# Patient Record
Sex: Female | Born: 1970 | Race: White | Hispanic: No | Marital: Married | Smoking: Current every day smoker
Health system: Southern US, Community
[De-identification: ages and names within clinical notes are randomized; demographics above are authoritative.]

## PROBLEM LIST (undated history)

## (undated) DIAGNOSIS — F32A Depression, unspecified: Secondary | ICD-10-CM

## (undated) DIAGNOSIS — E119 Type 2 diabetes mellitus without complications: Secondary | ICD-10-CM

## (undated) DIAGNOSIS — T7840XA Allergy, unspecified, initial encounter: Secondary | ICD-10-CM

## (undated) DIAGNOSIS — M1990 Unspecified osteoarthritis, unspecified site: Secondary | ICD-10-CM

## (undated) DIAGNOSIS — E785 Hyperlipidemia, unspecified: Secondary | ICD-10-CM

## (undated) DIAGNOSIS — K219 Gastro-esophageal reflux disease without esophagitis: Secondary | ICD-10-CM

## (undated) DIAGNOSIS — I499 Cardiac arrhythmia, unspecified: Secondary | ICD-10-CM

## (undated) DIAGNOSIS — D649 Anemia, unspecified: Secondary | ICD-10-CM

## (undated) DIAGNOSIS — I10 Essential (primary) hypertension: Secondary | ICD-10-CM

## (undated) DIAGNOSIS — G709 Myoneural disorder, unspecified: Secondary | ICD-10-CM

## (undated) DIAGNOSIS — F319 Bipolar disorder, unspecified: Secondary | ICD-10-CM

## (undated) DIAGNOSIS — F329 Major depressive disorder, single episode, unspecified: Secondary | ICD-10-CM

## (undated) DIAGNOSIS — F419 Anxiety disorder, unspecified: Secondary | ICD-10-CM

## (undated) HISTORY — DX: Essential (primary) hypertension: I10

## (undated) HISTORY — DX: Gastro-esophageal reflux disease without esophagitis: K21.9

## (undated) HISTORY — DX: Bipolar disorder, unspecified: F31.9

## (undated) HISTORY — DX: Anemia, unspecified: D64.9

## (undated) HISTORY — PX: TOE SURGERY: SHX1073

## (undated) HISTORY — DX: Myoneural disorder, unspecified: G70.9

## (undated) HISTORY — DX: Cardiac arrhythmia, unspecified: I49.9

## (undated) HISTORY — PX: AMPUTATION: SHX166

## (undated) HISTORY — DX: Allergy, unspecified, initial encounter: T78.40XA

## (undated) HISTORY — DX: Type 2 diabetes mellitus without complications: E11.9

## (undated) HISTORY — DX: Unspecified osteoarthritis, unspecified site: M19.90

## (undated) HISTORY — PX: PARTIAL HYSTERECTOMY: SHX80

## (undated) HISTORY — DX: Anxiety disorder, unspecified: F41.9

## (undated) HISTORY — DX: Depression, unspecified: F32.A

## (undated) HISTORY — PX: ANKLE SURGERY: SHX546

## (undated) HISTORY — DX: Hyperlipidemia, unspecified: E78.5

## (undated) HISTORY — PX: TUBAL LIGATION: SHX77

## (undated) HISTORY — DX: Major depressive disorder, single episode, unspecified: F32.9

## (undated) HISTORY — PX: BUNIONECTOMY: SHX129

---

## 2008-11-16 ENCOUNTER — Ambulatory Visit: Payer: Self-pay | Admitting: Gastroenterology

## 2009-01-18 ENCOUNTER — Ambulatory Visit: Payer: Self-pay | Admitting: Gastroenterology

## 2012-10-21 ENCOUNTER — Ambulatory Visit (INDEPENDENT_AMBULATORY_CARE_PROVIDER_SITE_OTHER): Payer: Medicaid Other

## 2012-10-21 ENCOUNTER — Encounter (INDEPENDENT_AMBULATORY_CARE_PROVIDER_SITE_OTHER): Payer: Self-pay

## 2012-10-21 VITALS — BP 110/80 | HR 109 | Temp 98.9°F | Resp 18 | Ht 70.0 in | Wt 280.0 lb

## 2012-10-21 DIAGNOSIS — L97509 Non-pressure chronic ulcer of other part of unspecified foot with unspecified severity: Secondary | ICD-10-CM

## 2012-10-21 DIAGNOSIS — E1149 Type 2 diabetes mellitus with other diabetic neurological complication: Secondary | ICD-10-CM

## 2012-10-21 DIAGNOSIS — G589 Mononeuropathy, unspecified: Secondary | ICD-10-CM

## 2012-10-21 DIAGNOSIS — M2040 Other hammer toe(s) (acquired), unspecified foot: Secondary | ICD-10-CM

## 2012-10-21 DIAGNOSIS — L02619 Cutaneous abscess of unspecified foot: Secondary | ICD-10-CM

## 2012-10-21 DIAGNOSIS — E1140 Type 2 diabetes mellitus with diabetic neuropathy, unspecified: Secondary | ICD-10-CM

## 2012-10-21 MED ORDER — MUPIROCIN CALCIUM 2 % EX CREA: TOPICAL_CREAM | Freq: Three times a day (TID) | CUTANEOUS | Status: DC

## 2012-10-21 MED ORDER — DOXYCYCLINE HYCLATE 100 MG PO TABS: 100.0000 mg | ORAL_TABLET | Freq: Two times a day (BID) | ORAL | Status: DC

## 2012-10-21 NOTE — Progress Notes (Signed)
Subjective:    Patient ID: Casey Villa, female    DOB: Jan 26, 1970, 42 y.o.   MRN: [id number]  HPI The third toe on left and draining and peeling and sore and tender and been going on for 3 months and went to doctor and was put on three antibotics and cipro, clindamycin and levaquin Patient has been in surrounds of antibiotics including Cipro and Levaquin and clindamycin the ulcers of the distal tuft of the third toe with overlying eschar tissue. Approximately 1 cm ulcers present down to subcutaneous tissue , does not probe down to bone. 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 Patient is to walking around socks around the house. Otherwise wears athletic shoes. Has been applying topical Neosporin Polysporin and Band-Aid dressings.  Review of Systems  Constitutional: Positive for fever and fatigue.  HENT:       Fungus in nose floria in both nasals  Eyes: Negative.   Respiratory: Positive for apnea, cough and wheezing.   Cardiovascular: Positive for chest pain.       Yesterday and went to ER hospital  Gastrointestinal:       Ibs  Endocrine: Positive for heat intolerance and polydipsia.  Genitourinary: Positive for frequency.  Musculoskeletal:       Balance issues  Skin:       RSD- relective sympathic disease  Allergic/Immunologic: Positive for environmental allergies and food allergies.       Aspretamine  Neurological: Positive for numbness and headaches.       Neuropathy  Hematological: Bruises/bleeds easily.  Psychiatric/Behavioral: Negative.        Objective:   Physical Exam  Vitals reviewed. Constitutional: She is oriented to person, place, and time. She appears well-developed and well-nourished.  Cardiovascular:  Pulses:      Dorsalis pedis pulses are 1+ on the right side, and 1+ on the left side.       Posterior tibial pulses are 0 on the right side, and 0 on the left side.  Capillary refill time 4 seconds all digits skin temperature warm  to cool bilateral all toes. Turgor diminished bilateral. Mild varicosities noted bilateral. Difficult to palpate pedal pulses. Mild +1 edema noted bilateral.  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 History of attitude second toe left foot one year ago. I can. Osteomyelitis and infection. Digital contractures 2 through 5 right 3 through 5 left. Semirigid. Associated ulceration distal tuft third left with overlying hemorrhagic keratoses.  Neurological: She is alert and oriented to person, place, and time. She has normal strength and normal reflexes.  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 Patient relates B12 deficiency neuropathy. Has five-year history of diabetes previously on medications currently back control only most recent A1c 5.2  Skin: No cyanosis. Nails show no clubbing.  Nails thick brittle criptotic bilateral. Skin color pigment normal hair growth absent bilateral. There is ulceration with hemorrhage a keratosis distal tuft third toe left foot. Upon debridement there is 1 cm ulcer down to subcutaneous tissue level. With pinpoint bleeding no malodor. No increased temperature. No ascending cellulitis or lymphangitis. Localized erythema to the third toe noted.  Psychiatric: She has a normal mood and affect. Her behavior is normal. Judgment and thought content normal.          Assessment & Plan:  Diabetes with peripheral neuropathy, possibly associated with B12 deficiency as well. Also noted history  of RSD and chronic pain syndrome. Ulcer third toe left foot with localized cellulitis. Ulcers debrided dressed with Iodosorb and gauze dressing at this time. Prescriptions for Bactroban cream twice daily application. Prescription for doxycycline twice daily x10 days. Daily dressing changes as instructed. Dispense Darco shoe to offload pressure  on the end of third toe. Advised reduced walking ambulation activities. Recheck in 2-3 weeks for followup on ulcer check. Contact us if any exacerbations and symptoms fever or chills were to occur.  Alvan Dame DPM

## 2012-10-21 NOTE — Patient Instructions (Signed)
Instructions for Wound Care  The most important step to healing a foot wound is to reduce the pressure on your foot - it is extremely important to stay off your foot as much as possible and wear the shoe/boot as instructed.  Cleanse your foot with saline wash or warm soapy water (dial antibacterial soap or similar).  Blot dry.  Apply prescribed medication to your wound and cover with gauze and a bandage.  May hold bandage in place with Coban (self sticky wrap), Ace bandage or tape.  You may find dressing supplies at your local [employer], [employer], drug store or medical supply store.  Your prescribed topical medication is :  Mupirocin Ointment (twice daily)       [facility] [employer] is a mail order medical supply company that we use to provide some of our would care products.  If we use their service of you, you will receive the product by mail.  If you have not received the medication in 3 business days, please call our office.  If you notice any foul odor, increase in pain, pus, increased swelling, red streaks or generalized redness occurring in your foot or leg-Call our office immediately to be seen.  This may be a sign of a limb or life threatening infection that will need prompt attention.  Casey Villa, Casey Villa  Casey Villa  [phone] Casey Villa   [phone] Casey Villa           Diabetes and Foot Care Diabetes may cause you to have a poor blood supply (circulation) to your legs and feet. Because of this, the skin may be thinner, break easier, and heal more slowly. You also may have nerve damage in your legs and feet causing decreased feeling. You may not notice minor injuries to your feet that could lead to serious problems or infections. Taking care of your feet is one of the most important things you can do for yourself.  HOME CARE INSTRUCTIONS  Do not go barefoot. Bare feet are easily injured.  Check your feet daily for blisters, cuts, and redness.  Wash your  feet with warm water (not hot) and mild soap. Pat your feet and between your toes until completely dry.  Apply a moisturizing lotion that does not contain alcohol or petroleum jelly to the dry skin on your feet and to dry brittle toenails. Do not put it between your toes.  Trim your toenails straight across. Do not dig under them or around the cuticle.  Do not cut corns or calluses, or try to remove them with medicine.  Wear clean cotton socks or stockings every day. Make sure they are not too tight. Do not wear knee high stockings since they may decrease blood flow to your legs.  Wear leather shoes that fit properly and have enough cushioning. To break in new shoes, wear them just a few hours a day to avoid injuring your feet.  Wear shoes at all times, even in the house.  Do not cross your legs. This may decrease the blood flow to your feet.  If you find a minor scrape, cut, or break in the skin on your feet, keep it and the skin around it clean and dry. These areas may be cleansed with mild soap and water. Do not use peroxide, alcohol, iodine or Merthiolate.  When you remove an adhesive bandage, be sure not to harm the skin around it.  If you have a wound, look at it several times a day to make  sure it is healing.  Do not use heating pads or hot water bottles. Burns can occur. If you have lost feeling in your feet or legs, you may not know it is happening until it is too late.  Report any cuts, sores or bruises to your caregiver. Do not wait! SEEK MEDICAL CARE IF:   You have an injury that is not healing or you notice redness, numbness, burning, or tingling.  Your feet always feel cold.  You have pain or cramps in your legs and feet. SEEK IMMEDIATE MEDICAL CARE IF:   There is increasing redness, swelling, or increasing pain in the wound.  There is a red line that goes up your leg.  Pus is coming from a wound.  You develop an unexplained oral temperature above 102 F (38.9  C), or as your caregiver suggests.  You notice a bad smell coming from an ulcer or wound. MAKE SURE YOU:   Understand these instructions.  Will watch your condition.  Will get help right away if you are not doing well or get worse. Document Released: 12/28/1999 Document Revised: 03/24/2011 Document Reviewed: 07/05/2008 Casey Villa Patient Information 2014 [place], [place].

## 2012-11-11 ENCOUNTER — Ambulatory Visit: Payer: Medicaid Other

## 2012-12-02 ENCOUNTER — Ambulatory Visit: Payer: Medicaid Other

## 2013-03-24 ENCOUNTER — Encounter (HOSPITAL_COMMUNITY): Payer: Self-pay | Admitting: Emergency Medicine

## 2013-03-24 ENCOUNTER — Emergency Department (HOSPITAL_COMMUNITY)
Admission: EM | Admit: 2013-03-24 | Discharge: 2013-03-24 | Disposition: A | Discharge status: Home / Self Care | Payer: Medicaid Other | Attending: Emergency Medicine | Admitting: Emergency Medicine

## 2013-03-24 ENCOUNTER — Emergency Department (HOSPITAL_COMMUNITY): Payer: Medicaid Other

## 2013-03-24 ENCOUNTER — Emergency Department (HOSPITAL_COMMUNITY)
Admission: EM | Admit: 2013-03-24 | Discharge: 2013-03-25 | Disposition: A | Discharge status: Home / Self Care | Payer: Medicaid Other | Source: Home / Self Care | Attending: Emergency Medicine | Admitting: Emergency Medicine

## 2013-03-24 DIAGNOSIS — R42 Dizziness and giddiness: Secondary | ICD-10-CM

## 2013-03-24 DIAGNOSIS — Z862 Personal history of diseases of the blood and blood-forming organs and certain disorders involving the immune mechanism: Secondary | ICD-10-CM | POA: Insufficient documentation

## 2013-03-24 DIAGNOSIS — E119 Type 2 diabetes mellitus without complications: Secondary | ICD-10-CM | POA: Insufficient documentation

## 2013-03-24 DIAGNOSIS — D649 Anemia, unspecified: Secondary | ICD-10-CM | POA: Insufficient documentation

## 2013-03-24 DIAGNOSIS — E1149 Type 2 diabetes mellitus with other diabetic neurological complication: Secondary | ICD-10-CM | POA: Insufficient documentation

## 2013-03-24 DIAGNOSIS — Z9889 Other specified postprocedural states: Secondary | ICD-10-CM | POA: Insufficient documentation

## 2013-03-24 DIAGNOSIS — Z79899 Other long term (current) drug therapy: Secondary | ICD-10-CM | POA: Insufficient documentation

## 2013-03-24 DIAGNOSIS — E785 Hyperlipidemia, unspecified: Secondary | ICD-10-CM

## 2013-03-24 DIAGNOSIS — F329 Major depressive disorder, single episode, unspecified: Secondary | ICD-10-CM

## 2013-03-24 DIAGNOSIS — K219 Gastro-esophageal reflux disease without esophagitis: Secondary | ICD-10-CM

## 2013-03-24 DIAGNOSIS — Z8669 Personal history of other diseases of the nervous system and sense organs: Secondary | ICD-10-CM | POA: Insufficient documentation

## 2013-03-24 DIAGNOSIS — Y929 Unspecified place or not applicable: Secondary | ICD-10-CM | POA: Insufficient documentation

## 2013-03-24 DIAGNOSIS — L03116 Cellulitis of left lower limb: Secondary | ICD-10-CM

## 2013-03-24 DIAGNOSIS — I10 Essential (primary) hypertension: Secondary | ICD-10-CM | POA: Insufficient documentation

## 2013-03-24 DIAGNOSIS — M129 Arthropathy, unspecified: Secondary | ICD-10-CM

## 2013-03-24 DIAGNOSIS — Z760 Encounter for issue of repeat prescription: Secondary | ICD-10-CM | POA: Insufficient documentation

## 2013-03-24 DIAGNOSIS — R110 Nausea: Secondary | ICD-10-CM

## 2013-03-24 DIAGNOSIS — F319 Bipolar disorder, unspecified: Secondary | ICD-10-CM | POA: Insufficient documentation

## 2013-03-24 DIAGNOSIS — F17200 Nicotine dependence, unspecified, uncomplicated: Secondary | ICD-10-CM | POA: Insufficient documentation

## 2013-03-24 DIAGNOSIS — Z880 Allergy status to penicillin: Secondary | ICD-10-CM | POA: Insufficient documentation

## 2013-03-24 DIAGNOSIS — I499 Cardiac arrhythmia, unspecified: Secondary | ICD-10-CM

## 2013-03-24 DIAGNOSIS — R079 Chest pain, unspecified: Secondary | ICD-10-CM | POA: Insufficient documentation

## 2013-03-24 DIAGNOSIS — Z9089 Acquired absence of other organs: Secondary | ICD-10-CM | POA: Insufficient documentation

## 2013-03-24 DIAGNOSIS — S98139A Complete traumatic amputation of one unspecified lesser toe, initial encounter: Secondary | ICD-10-CM | POA: Insufficient documentation

## 2013-03-24 DIAGNOSIS — R0602 Shortness of breath: Secondary | ICD-10-CM

## 2013-03-24 DIAGNOSIS — Z792 Long term (current) use of antibiotics: Secondary | ICD-10-CM

## 2013-03-24 DIAGNOSIS — Z791 Long term (current) use of non-steroidal anti-inflammatories (NSAID): Secondary | ICD-10-CM | POA: Insufficient documentation

## 2013-03-24 DIAGNOSIS — E1142 Type 2 diabetes mellitus with diabetic polyneuropathy: Secondary | ICD-10-CM | POA: Insufficient documentation

## 2013-03-24 DIAGNOSIS — F411 Generalized anxiety disorder: Secondary | ICD-10-CM

## 2013-03-24 DIAGNOSIS — G709 Myoneural disorder, unspecified: Secondary | ICD-10-CM | POA: Insufficient documentation

## 2013-03-24 DIAGNOSIS — S91109A Unspecified open wound of unspecified toe(s) without damage to nail, initial encounter: Secondary | ICD-10-CM | POA: Insufficient documentation

## 2013-03-24 DIAGNOSIS — Y939 Activity, unspecified: Secondary | ICD-10-CM | POA: Insufficient documentation

## 2013-03-24 DIAGNOSIS — F419 Anxiety disorder, unspecified: Secondary | ICD-10-CM

## 2013-03-24 DIAGNOSIS — W268XXA Contact with other sharp object(s), not elsewhere classified, initial encounter: Secondary | ICD-10-CM | POA: Insufficient documentation

## 2013-03-24 DIAGNOSIS — F3289 Other specified depressive episodes: Secondary | ICD-10-CM | POA: Insufficient documentation

## 2013-03-24 DIAGNOSIS — R000 Tachycardia, unspecified: Secondary | ICD-10-CM | POA: Insufficient documentation

## 2013-03-24 LAB — CBC WITH DIFFERENTIAL/PLATELET
BASOS ABS: 0.1 10*3/uL (ref 0.0–0.1)
BASOS PCT: 1 % (ref 0–1)
EOS PCT: 3 % (ref 0–5)
Eosinophils Absolute: 0.2 10*3/uL (ref 0.0–0.7)
HCT: 40.4 % (ref 36.0–46.0)
Hemoglobin: 13.5 g/dL (ref 12.0–15.0)
Lymphocytes Relative: 38 % (ref 12–46)
Lymphs Abs: 2.2 10*3/uL (ref 0.7–4.0)
MCH: 29.5 pg (ref 26.0–34.0)
MCHC: 33.4 g/dL (ref 30.0–36.0)
MCV: 88.4 fL (ref 78.0–100.0)
Monocytes Absolute: 0.4 10*3/uL (ref 0.1–1.0)
Monocytes Relative: 7 % (ref 3–12)
Neutro Abs: 2.9 10*3/uL (ref 1.7–7.7)
Neutrophils Relative %: 51 % (ref 43–77)
Platelets: 181 10*3/uL (ref 150–400)
RBC: 4.57 MIL/uL (ref 3.87–5.11)
RDW: 14.5 % (ref 11.5–15.5)
WBC: 5.7 10*3/uL (ref 4.0–10.5)

## 2013-03-24 LAB — BASIC METABOLIC PANEL
BUN: 10 mg/dL (ref 6–23)
CALCIUM: 8.9 mg/dL (ref 8.4–10.5)
CO2: 26 mEq/L (ref 19–32)
CREATININE: 0.71 mg/dL (ref 0.50–1.10)
Chloride: 104 mEq/L (ref 96–112)
Glucose, Bld: 97 mg/dL (ref 70–99)
Potassium: 3.8 mEq/L (ref 3.7–5.3)
Sodium: 142 mEq/L (ref 137–147)

## 2013-03-24 LAB — I-STAT CG4 LACTIC ACID, ED: LACTIC ACID, VENOUS: 1.27 mmol/L (ref 0.5–2.2)

## 2013-03-24 MED ORDER — OXYCODONE HCL 5 MG PO TABS
5.0000 mg | ORAL_TABLET | Freq: Once | ORAL | Status: AC
  Administered 2013-03-24: 5 mg via ORAL
  Filled 2013-03-24: qty 1

## 2013-03-24 MED ORDER — LORAZEPAM 1 MG PO TABS: 1.0000 mg | ORAL_TABLET | Freq: Once | ORAL | Status: DC

## 2013-03-24 MED ORDER — GI COCKTAIL ~~LOC~~
30.0000 mL | Freq: Once | ORAL | Status: AC
  Administered 2013-03-24: 30 mL via ORAL
  Filled 2013-03-24: qty 30

## 2013-03-24 MED ORDER — CLINDAMYCIN PHOSPHATE 900 MG/50ML IV SOLN
900.0000 mg | Freq: Once | INTRAVENOUS | Status: AC
  Administered 2013-03-24: 900 mg via INTRAVENOUS
  Filled 2013-03-24: qty 50

## 2013-03-24 MED ORDER — SODIUM CHLORIDE 0.9 % IV BOLUS (SEPSIS)
1000.0000 mL | Freq: Once | INTRAVENOUS | Status: AC
  Administered 2013-03-24: 1000 mL via INTRAVENOUS

## 2013-03-24 MED ORDER — HYDROMORPHONE HCL PF 1 MG/ML IJ SOLN
0.5000 mg | Freq: Once | INTRAMUSCULAR | Status: DC
  Filled 2013-03-24: qty 1

## 2013-03-24 MED ORDER — CLINDAMYCIN HCL 300 MG PO CAPS: 300.0000 mg | ORAL_CAPSULE | Freq: Four times a day (QID) | ORAL | Status: AC

## 2013-03-24 MED ORDER — LORAZEPAM 1 MG PO TABS
1.0000 mg | ORAL_TABLET | Freq: Once | ORAL | Status: AC
  Administered 2013-03-24: 1 mg via ORAL
  Filled 2013-03-24: qty 1

## 2013-03-24 MED ORDER — HYDROMORPHONE HCL PF 1 MG/ML IJ SOLN
1.0000 mg | Freq: Once | INTRAMUSCULAR | Status: AC
  Administered 2013-03-24: 1 mg via INTRAVENOUS
  Filled 2013-03-24: qty 1

## 2013-03-24 NOTE — ED Notes (Signed)
Advised x-ray tech to come back after IV team able to draw blood.

## 2013-03-24 NOTE — ED Notes (Signed)
Went into pts room to draw labs pt refused to draw labs pt stated that she has been poked 15 times today has none left.

## 2013-03-24 NOTE — ED Provider Notes (Addendum)
Medical screening examination/treatment/procedure(s) were conducted as a shared visit with non-physician practitioner(s) and myself.  I personally evaluated the patient during the encounter.   EKG Interpretation None      EMERGENCY DEPARTMENT US SOFT TISSUE INTERPRETATION "Study: Limited Ultrasound of the noted body part in comments below" INDICATIONS: Soft tissue infection Multiple views of the body part are obtained with a multi-frequency linear probe PERFORMED BY:  Myself IMAGES ARCHIVED?: Yes SIDE:Left BODY PART:Lower extremity FINDINGS: No abcess noted LIMITATIONS:  none INTERPRETATION:  No abcess noted COMMENT:    Aspiration of blood/fluid Performed by: Lyanne CoAMPOS,Mellisa Arshad M Consent obtained. Required items: required blood products, implants, devices, and special equipment available Patient identity confirmed: verbally with patient Time out: Immediately prior to procedure a "time out" was called to verify the correct patient, procedure, equipment, support staff and site/side marked as required. Preparation: Patient was prepped and draped in the usual sterile fashion. Patient tolerance: Patient tolerated the procedure well with no immediate complications. Location of aspiration: left antecubital fossa venous blood draw under US guidance.     7:25 AM Patient is overall well-appearing.  There is no deep abscess.  This appears to be a superficial cellulitis.  No fever in the emergency department.  Initial heart rate was in the 130s however on my valuation without treatment the patient's heart rate was 97.  Lactate is normal.  No hypotension.  Patient will return to the ER in 48 hours for recheck or sooner as needed.  She understands that this infection could worsen and understands to return if this is occurring.  The cellulitis itself will be outlined with a skin marker.   Lyanne CoKevin M Suesan Mohrmann, MD 03/24/13 [phone]  Lyanne CoKevin M Roy Tokarz, MD 03/24/13 [phone]  Lyanne CoKevin M Sallye Lunz, MD 03/24/13 [phone]

## 2013-03-24 NOTE — ED Notes (Signed)
Attempted to draw labs. Called phlebotomy spoke with amber

## 2013-03-24 NOTE — ED Notes (Signed)
Informed Charge RN that IV RN and Phlebotomy were unsuccessful in starting an IV and collecting blood work. Ian MalkinZach will attempt PIV and blood collection with an ultrasound.

## 2013-03-24 NOTE — Discharge Instructions (Signed)
Cellulitis Cellulitis is an infection of the skin and the tissue beneath it. The infected area is usually red and tender. Cellulitis occurs most often in the arms and lower legs.  CAUSES  Cellulitis is caused by bacteria that enter the skin through cracks or cuts in the skin. The most common types of bacteria that cause cellulitis are Staphylococcus and Streptococcus. SYMPTOMS   Redness and warmth.  Swelling.  Tenderness or pain.  Fever. DIAGNOSIS  Your caregiver can usually determine what is wrong based on a physical exam. Blood tests may also be done. TREATMENT  Treatment usually involves taking an antibiotic medicine. HOME CARE INSTRUCTIONS   Take your antibiotics as directed. Finish them even if you start to feel better.  Keep the infected arm or leg elevated to reduce swelling.  Apply a warm cloth to the affected area up to 4 times per day to relieve pain.  Only take over-the-counter or prescription medicines for pain, discomfort, or fever as directed by your caregiver.  Keep all follow-up appointments as directed by your caregiver. SEEK MEDICAL CARE IF:   You notice red streaks coming from the infected area.  Your red area gets larger or turns dark in color.  Your bone or joint underneath the infected area becomes painful after the skin has healed.  Your infection returns in the same area or another area.  You notice a swollen bump in the infected area.  You develop new symptoms. SEEK IMMEDIATE MEDICAL CARE IF:   You have a fever.  You feel very sleepy.  You develop vomiting or diarrhea.  You have a general ill feeling (malaise) with muscle aches and pains. MAKE SURE YOU:   Understand these instructions.  Will watch your condition.  Will get help right away if you are not doing well or get worse. Document Released: 10/09/2004 Document Revised: 07/01/2011 Document Reviewed: 03/17/2011 [employer] Patient Information 2014 [employer].  

## 2013-03-24 NOTE — ED Notes (Signed)
Lactic Acid reported to Dr.Campos 

## 2013-03-24 NOTE — ED Provider Notes (Signed)
CSN: [id number]     Arrival date & time 03/24/13  0319 History   First MD Initiated Contact with Patient 03/24/13 0356     Chief Complaint  Patient presents with  . Leg Pain    wound on leg on the inner calf    HPI  History provided by the patient. Patient is a 43 year old female with history of hypertension, hyperlipidemia and diabetes with peripheral neuropathy and previous left second toe" presents with complaints of worsened redness, pain swelling of the left lower leg. She states that her husband was helping her she is between 1-2 weeks ago when he actually cut the skin to the middle of the left leg. Since that time she has had slow healing of the cut. Over the last several days to week she has had increased swelling and redness with pain to the area. She states that she was having this when she went to a regular checkup with her doctor on Monday but forgot to show them or mention it. He denies having any associated fever, chills or sweats. She is also concerned of some redness and swelling to her left third toe. She is unsure how long it has been red or swollen. No other aggravating or alleviating factors. No other associated symptoms.    Past Medical History  Diagnosis Date  . Allergy   . Anemia   . Arthritis   . Anxiety   . Depression   . Diabetes mellitus without complication   . GERD (gastroesophageal reflux disease)   . Irregular heart beat   . Hyperlipidemia   . Hypertension   . Neuromuscular disorder   . Bipolar 1 disorder    Past Surgical History  Procedure Laterality Date  . Toe surgery      2nd toe left  . Bunionectomy      both big toes  . Ankle surgery      left ankle with screws and plate  . Cesarean section    . Tubal ligation    . Partial hysterectomy    . Amputation      Left toe   Family History  Problem Relation Age of Onset  . Heart attack Mother   . Heart attack Father    History  Substance Use Topics  . Smoking status: Current Every Day  Smoker -- 1.00 packs/day    Types: Cigarettes  . Smokeless tobacco: Never Used  . Alcohol Use: No   OB History   Grav Para Term Preterm Abortions TAB SAB Ect Mult Living                 Review of Systems  Constitutional: Negative for fever, chills and diaphoresis.  All other systems reviewed and are negative.      Allergies  Januvia; Morphine and related; Penicillins; Rocephin; and Sulfa antibiotics  Home Medications   Current Outpatient Rx  Name  Route  Sig  Dispense  Refill  . celecoxib (CELEBREX) 200 MG capsule   Oral   Take 200 mg by mouth 2 (two) times daily.         Marland Kitchen diltiazem (DILACOR XR) 180 MG 24 hr capsule   Oral   Take 180 mg by mouth daily.         . metaxalone (SKELAXIN) 800 MG tablet   Oral   Take 800 mg by mouth 3 (three) times daily.         Marland Kitchen omeprazole (PRILOSEC) 40 MG capsule   Oral  Take 40 mg by mouth daily.         . promethazine (PHENERGAN) 25 MG tablet   Oral   Take 25 mg by mouth every 6 (six) hours as needed for nausea.         . ziprasidone (GEODON) 60 MG capsule   Oral   Take 60 mg by mouth 2 (two) times daily with a meal.          BP 125/73  Pulse 122  Temp(Src) 97.6 F (36.4 C) (Oral)  Resp 17  Ht 5\' 10"  (1.778 m)  Wt 288 lb (130.636 kg)  BMI 41.32 kg/m2  SpO2 97% Physical Exam  Nursing note and vitals reviewed. Constitutional: She is oriented to person, place, and time. She appears well-developed and well-nourished. No distress.  HENT:  Head: Normocephalic.  Cardiovascular: Regular rhythm.  Tachycardia present.   Pulmonary/Chest: Effort normal and breath sounds normal.  Abdominal: Soft.  Musculoskeletal:  Amputated left second toe. There is significant erythema and swelling of the third toe. Dry cracking skin around all the toes in between the toes of the foot. No bleeding or drainage. No erythematous streaks.  Neurological: She is alert and oriented to person, place, and time.  Skin: Skin is warm and  dry. No rash noted.  Small wound to the medial aspect of the left lower leg. There is significant surrounding erythema and swelling with increased warmth.  Psychiatric: She has a normal mood and affect. Her behavior is normal.    ED Course  Procedures   DIAGNOSTIC STUDIES: Oxygen Saturation is 99% on room air.    COORDINATION OF CARE:  Nursing notes reviewed. Vital signs reviewed. Initial pt interview and examination performed.   4:06 AM-patient seen and evaluated. She does not appear in acute distress. Does not appear severely ill or toxic. Afebrile but is tachycardic. Discussed work up plan with pt at bedside, which includes lab tests, x-ray toe. Pt agrees with plan.  Testing delayed due to difficulties getting blood and starting IV.  Multiple attempts have been made.  Patient discussed with Dr. Patria Maneampos. He will continue to follow patient and lab results.  Treatment plan initiated: Medications  sodium chloride 0.9 % bolus 1,000 mL (not administered)     Imaging Review No results found.   EKG Interpretation None      MDM   Final diagnoses:  None        Casey Sellereter S Euva Rundell, PA-C 03/24/13 [phone]

## 2013-03-24 NOTE — ED Notes (Signed)
D/c I/v 

## 2013-03-24 NOTE — ED Notes (Signed)
Dr. Patria Maneampos and FergusonWhitney with Phlebotomy at bedside with ultrasound.  IV team unable to draw back for labs when IV was placed.  Antibiotics delayed for blood culture draw.

## 2013-03-24 NOTE — ED Notes (Signed)
Pt just discharged from the ED this am for cellulitis. Came back to the ER this evening for "racing heart" and "palpitations". Pt reports history of Afib and takes cardizem for it but has been out of the medication for 3 days. Pt requesting refill for cardizem.

## 2013-03-24 NOTE — ED Notes (Signed)
IV Team at bedside 

## 2013-03-24 NOTE — ED Provider Notes (Signed)
CSN: [id number]     Arrival date & time 03/24/13  1452 History   First MD Initiated Contact with Patient 03/24/13 1905     Chief Complaint  Patient presents with  . Chest Pain  . Shortness of Breath     (Consider location/radiation/quality/duration/timing/severity/associated sxs/prior Treatment) The history is provided by the patient. No language interpreter was used.  Casey Villa is a 43 year old female with past medical history of anemia, anxiety, depression, irregular heartbeat, hyperlipidemia, neuromuscular disease, bipolar disorder presenting to the ED with chest pain and shortness of breath that started at approximately 3:00 PM this afternoon. Stated that her heart was fluttering and racing-stated that she has a history of atrial fibrillation. Stated that this symptom lasted for approximately 15 minutes with associated dizziness and nausea. Stated that at this time she experienced pressure and a tightness underneath her breasts bilaterally. Stating that intermittently she's been having mild chest pain with associated dizziness and nausea. Patient reported that she normally takes Cardizem for her fluttering heart-reported that she's been out of his medication and has not taken the medication for approximately 3 days. Patient reported that she is also out of Wellbutrin, Xanax, Prilosec. Stated that she's tried to get in contact with her primary care provider, stated that she has called 4 times without an answer. Patient reported that she's extremely stressed out-as stated that she normally presents in this matter with her anxiety attacks. Stated that she is stressed secondary to her husband's state of health. Reported mild abdominal pain described as a cramping sensation generalized. Denied blurred vision, visual distortions, vomiting, weakness, numbness, tingling. PCP unknown   Past Medical History  Diagnosis Date  . Allergy   . Anemia   . Arthritis   . Anxiety   . Depression   . Diabetes  mellitus without complication   . GERD (gastroesophageal reflux disease)   . Irregular heart beat   . Hyperlipidemia   . Hypertension   . Neuromuscular disorder   . Bipolar 1 disorder    Past Surgical History  Procedure Laterality Date  . Toe surgery      2nd toe left  . Bunionectomy      both big toes  . Ankle surgery      left ankle with screws and plate  . Cesarean section    . Tubal ligation    . Partial hysterectomy    . Amputation      Left toe   Family History  Problem Relation Age of Onset  . Heart attack Mother   . Heart attack Father    History  Substance Use Topics  . Smoking status: Current Every Day Smoker -- 1.00 packs/day    Types: Cigarettes  . Smokeless tobacco: Never Used  . Alcohol Use: No   OB History   Grav Para Term Preterm Abortions TAB SAB Ect Mult Living                 Review of Systems  Constitutional: Negative for chills.  Respiratory: Positive for shortness of breath. Negative for chest tightness.   Cardiovascular: Positive for chest pain.  Gastrointestinal: Positive for nausea and abdominal pain. Negative for vomiting and diarrhea.  Neurological: Positive for dizziness. Negative for weakness and numbness.  All other systems reviewed and are negative.      Allergies  Januvia; Morphine and related; Penicillins; Rocephin; and Sulfa antibiotics  Home Medications   Current Outpatient Rx  Name  Route  Sig  Dispense  Refill  .  atorvastatin (LIPITOR) 20 MG tablet   Oral   Take 20 mg by mouth daily.         . celecoxib (CELEBREX) 200 MG capsule   Oral   Take 200 mg by mouth 2 (two) times daily.         . cholecalciferol (VITAMIN D) 1000 UNITS tablet   Oral   Take 1,000 Units by mouth daily.         . clindamycin (CLEOCIN) 300 MG capsule   Oral   Take 1 capsule (300 mg total) by mouth every 6 (six) hours.   28 capsule   0   . ferrous sulfate 325 (65 FE) MG tablet   Oral   Take 325 mg by mouth daily with  breakfast.         . gabapentin (NEURONTIN) 600 MG tablet   Oral   Take 600 mg by mouth 3 (three) times daily.         . metaxalone (SKELAXIN) 800 MG tablet   Oral   Take 800 mg by mouth 3 (three) times daily.         . Multiple Vitamin (MULTIVITAMIN WITH MINERALS) TABS tablet   Oral   Take 1 tablet by mouth daily.         Marland Kitchen omeprazole (PRILOSEC) 40 MG capsule   Oral   Take 40 mg by mouth daily.         . Oxycodone HCl 10 MG TABS   Oral   Take 10 mg by mouth every 6 (six) hours.         Marland Kitchen oxymorphone (OPANA ER) 20 MG 12 hr tablet   Oral   Take 20-40 mg by mouth See admin instructions. Take 2 tablets in the morning and 1 tablet at bedtime         . promethazine (PHENERGAN) 25 MG tablet   Oral   Take 25 mg by mouth every 6 (six) hours as needed for nausea.         . vitamin B-12 (CYANOCOBALAMIN) 100 MCG tablet   Oral   Take 100 mcg by mouth daily.         . ziprasidone (GEODON) 60 MG capsule   Oral   Take 60 mg by mouth 2 (two) times daily with a meal.         . ALPRAZolam (XANAX) 0.5 MG tablet   Oral   Take 1 tablet (0.5 mg total) by mouth at bedtime as needed for anxiety.   5 tablet   0   . buPROPion (WELLBUTRIN XL) 150 MG 24 hr tablet   Oral   Take 1 tablet (150 mg total) by mouth daily.   6 tablet   0   . citalopram (CELEXA) 40 MG tablet   Oral   Take 1 tablet (40 mg total) by mouth daily.   9 tablet   0   . diltiazem (DILACOR XR) 180 MG 24 hr capsule   Oral   Take 1 capsule (180 mg total) by mouth daily.   9 capsule   0    BP 136/58  Pulse 89  Temp(Src) 98.1 F (36.7 C) (Oral)  Resp 23  SpO2 99% Physical Exam  Nursing note and vitals reviewed. Constitutional: She is oriented to person, place, and time. She appears well-developed and well-nourished. No distress.  HENT:  Head: Normocephalic and atraumatic.  Mouth/Throat: Oropharynx is clear and moist. No oropharyngeal exudate.  Eyes: Conjunctivae and EOM are normal.  Pupils are equal, round, and reactive to light. Right eye exhibits no discharge. Left eye exhibits no discharge.  Negative nystagmus Visual fields grossly intact   Neck: Normal range of motion. Neck supple. No tracheal deviation present.  Negative neck stiffness Negative nuchal rigidity Negative cervical lymphadenopathy  Cardiovascular: Normal rate, regular rhythm and normal heart sounds.  Exam reveals no friction rub.   No murmur heard. Pulses:      Radial pulses are 2+ on the right side, and 2+ on the left side.  Cap refill less than 3 seconds Swelling identified to the lower legs bilaterally with negative pitting edema noted  Pulmonary/Chest: Effort normal. No respiratory distress. She has wheezes. She has no rales. She exhibits no tenderness.  Wheezes noted to upper lower lobes bilaterally, expiratory  Abdominal: Soft. Bowel sounds are normal. There is no tenderness. There is no guarding.  Musculoskeletal: Normal range of motion.  Full ROM to upper and lower extremities without difficulty noted, negative ataxia noted.  Lymphadenopathy:    She has no cervical adenopathy.  Neurological: She is alert and oriented to person, place, and time. No cranial nerve deficit. She exhibits normal muscle tone. Coordination normal.  Cranial nerves III-XII grossly intact Strength 5+/5+ to upper and lower extremities bilaterally with resistance applied, equal distribution noted Equal grip strength Sensation intact  Skin: Skin is warm and dry. No rash noted. She is not diaphoretic. No erythema.  Left second toe amputation noted. Cellulitis identified to the lower left extremity demarcated with purple marker. Negative signs of spreading of the cellulitis outside the marker.  Psychiatric: She has a normal mood and affect. Her behavior is normal. Thought content normal.  Patient appears anxious and is requesting anxiety medications Tearful intermittently throughout interview and physical exam    ED  Course  Procedures (including critical care time)  Patient refused albuterol stated that it makes her anxious and she already is anxious. Does not want the breathing treatment.   1:29 AM This provider spoke with the patient in great detail regarding labs and imaging. Discussed with patient plan for discharge. Patient reported that she is feeling better and would like to be discharged. Upon discharge patient requesting refill of diltizem, xanax, wellbutrin, celexa to hold her until Monday for when she sees her PCP.   Results for orders placed during the hospital encounter of 03/24/13  PRO B NATRIURETIC PEPTIDE      Result Value Ref Range   Pro B Natriuretic peptide (BNP) 100.5  0 - 125 pg/mL  LIPASE, BLOOD      Result Value Ref Range   Lipase 16  11 - 59 U/L  CBC      Result Value Ref Range   WBC 5.5  4.0 - 10.5 K/uL   RBC 4.55  3.87 - 5.11 MIL/uL   Hemoglobin 13.8  12.0 - 15.0 g/dL   HCT 16.1  09.6 - 04.5 %   MCV 88.1  78.0 - 100.0 fL   MCH 30.3  26.0 - 34.0 pg   MCHC 34.4  30.0 - 36.0 g/dL   RDW 40.9  81.1 - 91.4 %   Platelets 199  150 - 400 K/uL  BASIC METABOLIC PANEL      Result Value Ref Range   Sodium 139  137 - 147 mEq/L   Potassium 4.3  3.7 - 5.3 mEq/L   Chloride 100  96 - 112 mEq/L   CO2 27  19 - 32 mEq/L   Glucose, Bld 84  70 - 99 mg/dL   BUN 10  6 - 23 mg/dL   Creatinine, Ser 4.690.77  0.50 - 1.10 mg/dL   Calcium 9.3  8.4 - 62.910.5 mg/dL   GFR calc non Af Amer >90  >90 mL/min   GFR calc Af Amer >90  >90 mL/min  I-STAT TROPOININ, ED      Result Value Ref Range   Troponin i, poc 0.00  0.00 - 0.08 ng/mL   Comment 3            Dg Chest 2 View  03/24/2013   CLINICAL DATA:  Diabetes, peripheral cellulitis, shortness of breath, hypertension  EXAM: CHEST  2 VIEW  COMPARISON:  01/11/2013  FINDINGS: The heart size and mediastinal contours are within normal limits. Both lungs are clear. The visualized skeletal structures are unremarkable. Thoracic degenerative changes and  spondylosis noted.  IMPRESSION: No active cardiopulmonary disease.   Electronically Signed   By: Ruel Favorsrevor  Shick M.D.   On: 03/24/2013 15:24    Labs Review Labs Reviewed  PRO B NATRIURETIC PEPTIDE  LIPASE, BLOOD  CBC  BASIC METABOLIC PANEL  I-STAT TROPOININ, ED   Imaging Review Dg Chest 2 View  03/24/2013   CLINICAL DATA:  Diabetes, peripheral cellulitis, shortness of breath, hypertension  EXAM: CHEST  2 VIEW  COMPARISON:  01/11/2013  FINDINGS: The heart size and mediastinal contours are within normal limits. Both lungs are clear. The visualized skeletal structures are unremarkable. Thoracic degenerative changes and spondylosis noted.  IMPRESSION: No active cardiopulmonary disease.   Electronically Signed   By: Ruel Favorsrevor  Shick M.D.   On: 03/24/2013 15:24   Dg Toe 3rd Left  03/24/2013   CLINICAL DATA Evaluate for osteomyelitis.  EXAM LEFT THIRD TOE  COMPARISON Prior radiograph from 09/23/2012  FINDINGS Percutaneous lack fixation screw again seen traversing the left first IP joint. Additional fixation pins traverse of the left first metatarsal. No evidence of hardware complication. No periprosthetic lucency to suggest loosening or infection.  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 No osseous erosion seen at the left second metatarsal head to suggest osteomyelitis.  There has been progressive osseous erosion of the left third distal phalanx, suspicious for acute osteomyelitis. No soft tissue emphysema. Overlying soft tissue ulceration is present. No other osseous erosions identified.  No acute fracture dislocation.  IMPRESSION 1. Progressive osseous erosion of the left third distal phalanx with overlying soft tissue ulceration, worrisome for osteomyelitis. No soft tissue emphysema identified. 2. No acute fracture or dislocation.  SIGNATURE  Electronically Signed   By: Rise MuBenjamin  McClintock M.D.   On: 03/24/2013 06:38     EKG Interpretation None      Date: 03/25/2013  Rate: 87   Rhythm: normal sinus rhythm  QRS Axis: normal  Intervals: normal  ST/T Wave abnormalities: normal  Conduction Disutrbances:none  Narrative Interpretation:   Old EKG Reviewed: unchanged EKG analyzed and reviewed by attending physician and this provider.     MDM   Final diagnoses:  Anxiety  Medication refill  Chest pain   Medications  gi cocktail (Maalox,Lidocaine,Donnatal) (30 mLs Oral Given 03/24/13 1929)  LORazepam (ATIVAN) tablet 1 mg (1 mg Oral Given 03/24/13 2031)  sodium chloride 0.9 % bolus 1,000 mL (1,000 mLs Intravenous New Bag/Given 03/24/13 2312)  oxyCODONE (Oxy IR/ROXICODONE) immediate release tablet 5 mg (5 mg Oral Given 03/24/13 2233)  HYDROmorphone (DILAUDID) injection 0.5 mg (0.5 mg Intravenous Given 03/25/13 0047)   Filed Vitals:   03/24/13 1930 03/24/13 1945  03/24/13 2136 03/25/13 0047  BP: 119/83 125/73 128/100 136/58  Pulse: 92 84 89   Temp:      TempSrc:      Resp: 19 23 18 23   SpO2: 98% 100% 100% 99%    Patient presenting to the ED with chest tightness/pressure underneath her breasts bilaterally with associated heart racing, fluttering that occurred this afternoon approximately 3:00 PM. Stated that she's been having dizziness, nausea associated with this episode. Stated that the episode this afternoon lasted for approximately 15 minutes. Stated that she normally takes Cardizem, stated that she has not taken Cardizem within about 3 days. Stated that she's been unable to get in contact with her primary care provider regarding this issue. Stated that she ran out of Xanax, Wellbutrin, Celexa. Patient stated that she's been under a lot of stress secondary to her husband's state of health.  Patient was seen and assessed in the ED setting earlier this morning for leg pain-was diagnosed with cellulitis to the left lower extremity. Patient was given IV clindamycin in ED setting and discharged with IV clindamycin. Demarcation of the skin identified. Alert and oriented. GCS  15. Heart rate and rhythm normal. Lungs noted to have expiratory wheezes to upper and lower lobes bilaterally. Radial and DP pulses 2+ bilaterally. Negative use of accessory muscles. Negative signs of respiratory distress. Patient stable to speak in full sentences without difficulty. Bowel sounds normal active in all 4 quadrants with negative pain upon palpation-benign abdominal exam, nonsurgical abdomen noted. Cranial nerves grossly intact. EKG noted normal sinus rhythm with a heart rate of 87 beats per minute. I-STAT troponin negative elevation. CBC negative findings-negative elevation white blood cell count. BMP negative findings. BNP negative elevation. Lipase negative elevation. Chest x-ray negative for acute cardiac pulmonary disease. Pain medications administered in ED setting as well as anxiety medications-patient calm down. Has not experienced chest pain while in ED setting. Negative episodes or emesis while in ED setting. Suspicion to be anxiety and stressors in life leading to discomfort - patient tearful intermittently throughout stay in the ED. HEART score 2. Discussed case with attending physician who agreed to discharge. Patient requesting medications to be refilled. Patient stable, afebrile. Patient does not appear septic. Patient discharged. Discharged patient with a couple of days worth of her medication to hold her over til Monday when she sees her PCP. Discussed with patient to rest and stay hydrated. Discussed with patient to avoid any physical or strenuous activity. Discussed with patient to closely monitor symptoms and if symptoms are to worsen or change to report back to the ED - strict return instructions given.  Patient agreed to plan of care, understood, all questions answered.   Raymon Mutton, PA-C 03/25/13 1328

## 2013-03-24 NOTE — ED Notes (Signed)
Pt was seen here earlier this am for cellulitis to left leg. Reports walking upstairs to visit a family member had and had sob, mid chest tightness and palpitations. Reports hx of irregular HR.

## 2013-03-24 NOTE — ED Notes (Signed)
Phlebotomy and this RN at bedside attempting to draw labs and start IV with no success.  IV team paged and responded.

## 2013-03-24 NOTE — ED Notes (Addendum)
Pt presents from home with c/o of left leg pain on the inner calf and toe pain of the left foot.  Pt leg is red with a black scab that is 1.5cm x 1cm in diameter.  Pt states she has has "redish brown drainage coming from the leg wound", no discharge present in the ED.  Pt toe adjacent to the big toe (one toe is missing) is red with a "throbbing pain", limited movement of the toe.

## 2013-03-24 NOTE — ED Notes (Signed)
Margins marked on L lower leg, pt verbalizes understanding of purpose of monitoring & to return for increase of redness

## 2013-03-24 NOTE — ED Notes (Signed)
IV team at the bedside starting a PIV and collecting blood work.

## 2013-03-25 LAB — BASIC METABOLIC PANEL
BUN: 10 mg/dL (ref 6–23)
CO2: 27 mEq/L (ref 19–32)
CREATININE: 0.77 mg/dL (ref 0.50–1.10)
Calcium: 9.3 mg/dL (ref 8.4–10.5)
Chloride: 100 mEq/L (ref 96–112)
GFR calc Af Amer: >90 mL/min (ref >90)
Glucose, Bld: 84 mg/dL (ref 70–99)
Potassium: 4.3 mEq/L (ref 3.7–5.3)
SODIUM: 139 meq/L (ref 137–147)

## 2013-03-25 LAB — I-STAT TROPONIN, ED: Troponin i, poc: 0 ng/mL (ref 0.00–0.08)

## 2013-03-25 LAB — CBC
HCT: 40.1 % (ref 36.0–46.0)
Hemoglobin: 13.8 g/dL (ref 12.0–15.0)
MCH: 30.3 pg (ref 26.0–34.0)
MCHC: 34.4 g/dL (ref 30.0–36.0)
MCV: 88.1 fL (ref 78.0–100.0)
PLATELETS: 199 10*3/uL (ref 150–400)
RBC: 4.55 MIL/uL (ref 3.87–5.11)
RDW: 14.4 % (ref 11.5–15.5)
WBC: 5.5 10*3/uL (ref 4.0–10.5)

## 2013-03-25 LAB — LIPASE, BLOOD: Lipase: 16 U/L (ref 11–59)

## 2013-03-25 LAB — PRO B NATRIURETIC PEPTIDE: Pro B Natriuretic peptide (BNP): 100.5 pg/mL (ref 0–125)

## 2013-03-25 MED ORDER — HYDROMORPHONE HCL PF 1 MG/ML IJ SOLN
0.5000 mg | Freq: Once | INTRAMUSCULAR | Status: AC
  Administered 2013-03-25: 0.5 mg via INTRAVENOUS
  Filled 2013-03-25: qty 1

## 2013-03-25 MED ORDER — DILTIAZEM HCL ER 180 MG PO CP24: 180.0000 mg | ORAL_CAPSULE | Freq: Every day | ORAL | Status: AC

## 2013-03-25 MED ORDER — ALPRAZOLAM 0.5 MG PO TABS: 0.5000 mg | ORAL_TABLET | ORAL | Status: DC

## 2013-03-25 MED ORDER — CITALOPRAM HYDROBROMIDE 40 MG PO TABS: 40.0000 mg | ORAL_TABLET | Freq: Every day | ORAL | Status: AC

## 2013-03-25 MED ORDER — BUPROPION HCL ER (SR) 150 MG PO TB12: 150.0000 mg | ORAL_TABLET | Freq: Two times a day (BID) | ORAL | Status: DC

## 2013-03-25 MED ORDER — ALPRAZOLAM 0.5 MG PO TABS: 0.5000 mg | ORAL_TABLET | Freq: Every evening | ORAL | Status: AC | PRN

## 2013-03-25 MED ORDER — BUPROPION HCL ER (XL) 150 MG PO TB24: 150.0000 mg | ORAL_TABLET | Freq: Every day | ORAL | Status: AC

## 2013-03-25 NOTE — ED Provider Notes (Signed)
Medical screening examination/treatment/procedure(s) were performed by non-physician practitioner and as supervising physician I was immediately available for consultation/collaboration.   EKG Interpretation None        Darlys Galesavid Humphrey Guerreiro, MD 03/25/13 1356

## 2013-03-25 NOTE — Discharge Instructions (Signed)
Please call your doctor for a followup appointment within 24-48 hours. When you talk to your doctor please let them know that you were seen in the emergency department and have them acquire all of your records so that they can discuss the findings with you and formulate a treatment plan to fully care for your new and ongoing problems. Please call and set-up an appointment with her primary care provider to be reassessed within the next 24-48 hours Please take medications as prescribed Please rest and stay hydrated Please avoid any physical strenuous activity Please continue monitor symptoms closely and if symptoms are to worsen or change (fever the 101, chills, chest pain, shortness of breath, difficulty breathing, sweating, dizziness, room spinning sensation, nausea, vomiting, diarrhea, stomach pain, numbness, tingling, disorientation) please report back to the ED immediately   Chest Pain (Nonspecific) It is often hard to give a specific diagnosis for the cause of chest pain. There is always a chance that your pain could be related to something serious, such as a heart attack or a blood clot in the lungs. You need to follow up with your caregiver for further evaluation. CAUSES   Heartburn.  Pneumonia or bronchitis.  Anxiety or stress.  Inflammation around your heart (pericarditis) or lung (pleuritis or pleurisy).  A blood clot in the lung.  A collapsed lung (pneumothorax). It can develop suddenly on its own (spontaneous pneumothorax) or from injury (trauma) to the chest.  Shingles infection (herpes zoster virus). The chest wall is composed of bones, muscles, and cartilage. Any of these can be the source of the pain.  The bones can be bruised by injury.  The muscles or cartilage can be strained by coughing or overwork.  The cartilage can be affected by inflammation and become sore (costochondritis). DIAGNOSIS  Lab tests or other studies, such as X-rays, electrocardiography, stress  testing, or cardiac imaging, may be needed to find the cause of your pain.  TREATMENT   Treatment depends on what may be causing your chest pain. Treatment may include:  Acid blockers for heartburn.  Anti-inflammatory medicine.  Pain medicine for inflammatory conditions.  Antibiotics if an infection is present.  You may be advised to change lifestyle habits. This includes stopping smoking and avoiding alcohol, caffeine, and chocolate.  You may be advised to keep your head raised (elevated) when sleeping. This reduces the chance of acid going backward from your stomach into your esophagus.  Most of the time, nonspecific chest pain will improve within 2 to 3 days with rest and mild pain medicine. HOME CARE INSTRUCTIONS   If antibiotics were prescribed, take your antibiotics as directed. Finish them even if you start to feel better.  For the next few days, avoid physical activities that bring on chest pain. Continue physical activities as directed.  Do not smoke.  Avoid drinking alcohol.  Only take over-the-counter or prescription medicine for pain, discomfort, or fever as directed by your caregiver.  Follow your caregiver's suggestions for further testing if your chest pain does not go away.  Keep any follow-up appointments you made. If you do not go to an appointment, you could develop lasting (chronic) problems with pain. If there is any problem keeping an appointment, you must call to reschedule. SEEK MEDICAL CARE IF:   You think you are having problems from the medicine you are taking. Read your medicine instructions carefully.  Your chest pain does not go away, even after treatment.  You develop a rash with blisters on your chest.  SEEK IMMEDIATE MEDICAL CARE IF:   You have increased chest pain or pain that spreads to your arm, neck, jaw, back, or abdomen.  You develop shortness of breath, an increasing cough, or you are coughing up blood.  You have severe back or  abdominal pain, feel nauseous, or vomit.  You develop severe weakness, fainting, or chills.  You have a fever. THIS IS AN EMERGENCY. Do not wait to see if the pain will go away. Get medical help at once. Call your local emergency services (911 in U.S.). Do not drive yourself to the hospital. MAKE SURE YOU:   Understand these instructions.  Will watch your condition.  Will get help right away if you are not doing well or get worse. Document Released: 10/09/2004 Document Revised: 03/24/2011 Document Reviewed: 08/05/2007 [facility] Patient Information 2014 [place], [place]. Generalized Anxiety Disorder Generalized anxiety disorder (GAD) is a mental disorder. It interferes with life functions, including relationships, work, and school. GAD is different from normal anxiety, which everyone experiences at some point in their lives in response to specific life events and activities. Normal anxiety actually helps [place] prepare for and get through these life events and activities. Normal anxiety goes away after the event or activity is over.  GAD causes anxiety that is not necessarily related to specific events or activities. It also causes excess anxiety in proportion to specific events or activities. The anxiety associated with GAD is also difficult to control. GAD can vary from mild to severe. People with severe GAD can have intense waves of anxiety with physical symptoms (panic attacks).  SYMPTOMS The anxiety and worry associated with GAD are difficult to control. This anxiety and worry are related to many life events and activities and also occur more days than not for 6 months or longer. People with GAD also have three or more of the following symptoms (one or more in children):  Restlessness.   Fatigue.  Difficulty concentrating.   Irritability.  Muscle tension.  Difficulty sleeping or unsatisfying sleep. DIAGNOSIS GAD is diagnosed through an assessment by your caregiver. Your caregiver  will ask you questions aboutyour mood,physical symptoms, and events in your life. Your caregiver may ask you about your medical history and use of alcohol or drugs, including prescription medications. Your caregiver may also do a physical exam and blood tests. Certain medical conditions and the use of certain substances can cause symptoms similar to those associated with GAD. Your caregiver may refer you to a mental health specialist for further evaluation. TREATMENT The following therapies are usually used to treat GAD:   Medication Antidepressant medication usually is prescribed for long-term daily control. Antianxiety medications may be added in severe cases, especially when panic attacks occur.   Talk therapy (psychotherapy) Certain types of talk therapy can be helpful in treating GAD by providing support, education, and guidance. A form of talk therapy called cognitive behavioral therapy can teach you healthy ways to think about and react to daily life events and activities.  Stress managementtechniques These include yoga, meditation, and exercise and can be very helpful when they are practiced regularly. A mental health specialist can help determine which treatment is best for you. Some people see improvement with one therapy. However, other people require a combination of therapies. Document Released: 04/26/2012 Document Reviewed: 04/26/2012 [facility] Patient Information 2014 [place], [place].   Emergency Department Resource Guide 1) Find a Doctor and Pay Out of Pocket Although you won't have to find out who is covered by your insurance plan,  it is a good idea to ask around and get recommendations. You will then need to call the office and see if the doctor you have chosen will accept you as a new patient and what types of options they offer for patients who are self-pay. Some doctors offer discounts or will set up payment plans for their patients who do not have insurance, but you will need  to ask so you aren't surprised when you get to your appointment.  2) Contact Your Local Health Department Not all health departments have doctors that can see patients for sick visits, but many do, so it is worth a call to see if yours does. If you don't know where your local health department is, you can check in your phone book. The CDC also has a tool to help you locate your state's health department, and many state websites also have listings of all of their local health departments.  3) Find a Walk-in Clinic If your illness is not likely to be very severe or complicated, you may want to try a walk in clinic. These are popping up all over the country in pharmacies, drugstores, and shopping centers. They're usually staffed by nurse practitioners or physician assistants that have been trained to treat common illnesses and complaints. They're usually fairly quick and inexpensive. However, if you have serious medical issues or chronic medical problems, these are probably not your best option.  No Primary Care Doctor: - Call Health Connect at  [phone] - they can help you locate a primary care doctor that  accepts your insurance, provides certain services, etc. - Physician Referral Service- [phone]  Chronic Pain Problems: Organization         Address  Phone   Notes  [facility]  [phone]) [phone] Patients need to be referred by their primary care doctor.   Medication Assistance: Organization         Address  Phone   Notes  [facility] [address] [place], [place] [address] [phone] --Must be a resident of [place] -- Must have NO insurance coverage whatsoever (no Medicaid/ Medicare, etc.) -- The pt. MUST have a primary care doctor that directs their care regularly and follows them in the community   [facility]ist  [phone]   [employer]  [phone]    Agencies that provide inexpensive medical  care: Organization         Address  Phone   Notes  [facility]  [phone]   [facility]    [phone]   [facility] [address] [place], [place] [address] [phone]) [phone]   [facility] [address], [place] [phone]   [facility]anned Parenthood    [phone]   [facility]    [phone]   [facility]  [address], [place] Phone:  [phone](336) [phone], Fax:  [phone] Hours of Operation:  9 am - 6 pm, M-F.  Also accepts Medicaid/Medicare and self-pay.  [facility]  [address], [place] Phone: [phone], Fax: [phone]. Hours of Operation:  8:30 am - 5:30 pm, M-F.  Also accepts Medicaid and self-pay.  [facility] [place] [address], [place] Phone: [phone]) [phone]   [facility] [address], [place], [place] [phone], Ext. [phone] Mondays & Thursdays: 7-9  AM.  First 15 patients are seen on a first come, first serve basis.    Medicaid-accepting [facility] Providers:  Organization         Address  Phone   Notes  [facility] [address], [place] [phone] Also accepts self-pay patients.  [facility] [address] Laurell Josephs [place], [place]  [phone]   [facility] [address], [place] [phone]   [facility] [address], [place] [phone]   Renaye Rakers [address], [place]   [phone] Only accepts [place] Access [place] patients after they have their name applied to their card.   Self-Pay (no insurance) in [facility]:  Organization         Address  Phone   Notes  Sickle Cell Patients, [facility] [address] [place], [place] [phone]   [facility] [address] [place],  [place] [phone]   [facility]  [address], [place] [phone]   [facility]/Dr. Osei-Bonsu  [address] [place] or [address], [place] [phone] Phone number for both [place] and [place] locations is the same.  [facility]gent Medical and [facility] [address], [place] [phone]   [facility] [address], [place] or [address] [phone]   [facility] [address] [place] [phone], phone; [phone], fax Sees patients 1st and 3rd Saturday of every month.  Must not qualify for public or private insurance (i.e. Medicaid, Medicare, [place] Health Choice, Veterans' Benefits)  Household income should be no more than 200% of the poverty level The clinic cannot treat you if you are pregnant or think you are pregnant  Sexually transmitted diseases are not treated at the clinic.    Dental Care: Organization         Address  Phone  Notes  [facility] Department of [facility] [address] [place], [place] [phone] Accepts children up to age 54 who are enrolled in [place] or [place] Health Choice; pregnant women with a Medicaid card; and children who have applied for Medicaid or [place] Health Choice, but were declined, whose parents can pay a reduced fee at time of service.  [facility] of [facility]  [address], [place] [phone] Accepts children up to age 39 who are enrolled in [place] or [place] Health Choice; pregnant women with a Medicaid card; and children who have applied for Medicaid or [place] Health Choice, but were declined, whose parents can pay a reduced fee at time of service.  [facility]  [address] [place], [place] [phone] Patients are seen by appointment only. Walk-ins are not accepted. [facility] will see patients 6 years of age and older. Monday - Tuesday (8am-5pm) Most Wednesdays (8:30-5pm) $30 per visit, cash only  [facility] Access PRO[facility]  [address], [facility] [phone] Patients are seen by appointment only. Walk-ins are not accepted. [facility] will see patients 34 years of age and older. One Wednesday Evening (Monthly: Volunteer Based).  $30 per visit, cash only  [employer]  [phone] for adults; Children under age 71,  call Graduate Pediatric Dentistry at [phone]. Children aged 62-14, please call [phone] to request a pediatric application.  Dental services are provided in all areas of dental care including fillings, crowns and bridges, complete and partial dentures, implants, gum treatment, root canals, and extractions. Preventive care is also provided. Treatment is provided to both adults and children. Patients are selected via a lottery and there is often a waiting list.   [facility] [address], [place]  [phone] [email or web address]   [facility] [address] [place], [place] [phone], Ext. [phone] Second and Fourth Thursday of each month, opens at 6:30 AM; Clinic ends at 9 AM.  Patients are seen on a first-come first-served basis, and a limited number are seen during each clinic.   [facility]  [address] Ether Griffins [place], [place] [phone]   Eligibility Requirements You must have lived in [place], [place], or [place] counties for at least the last three months.   You cannot be eligible for state or federal sponsored [place], including [employer], [place], or [employer].   You generally cannot be eligible for healthcare insurance through your employer.    How to apply: Eligibility screenings are held every Tuesday and Wednesday afternoon from 1:00 pm until 4:00 pm. You do not need an appointment for the interview!   [facility] [address], [place], [place] [phone]   [facility] Health Department  [phone]   [facility] Health Department  [phone]   [facility]  [phone]    Behavioral Health Resources in the Community: Intensive Outpatient Programs Organization         Address  Phone  Notes  [facility] [address], [place], [place] [phone]   [facility] [address], [place], [place] [phone]   [facility] [address] [place], [place]  [phone]   [facility] [address],  [place], [place] [phone] or [phone]   Substance Abuse Resources Organization         Address  Phone  Notes  [facility]  [phone]   [facility]  [phone]   [facility] [place]  [phone]   Floydene Flock  [phone]   Residential & Outpatient Substance Abuse Program  [phone]   Psychological Services Organization         Address  Phone  Notes  [facility] Behavioral Health  [phone]   [facility]  [phone]   [facility] Mental Health [address] [place] [phone] or [phone]    Mobile Crisis Teams Organization         Address  Phone  Notes  [facility], Mobile Crisis Care Unit  [phone]   [facility]  [address] [place], [place] [phone]   Doristine Locks [address] [place] [phone]    Self-Help/Support Groups Organization         Address  Phone             Notes  [facility] [place] - variety of support groups  [phone]- I7437963 Call for more information  Narcotics Anonymous (NA), [facility] [address], [employer] [place]  2 meetings at this location   Statistician         Address  Phone  Notes  [facility] [address],  [place]  [phone]   [facility]  [address] [place] [address], [place], [place] [phone]   [facility] [address] [place], [place] [phone] Admissions: 8am-3pm M-F  [facility] [address],    [place], [place] [phone]   [facility] [address] [place], [place], [place] [phone]   [facility] [address]  [place], [place] [phone]   [facility] - Intensive Outpatient [address]ure[address], [place], [place] [phone]   [facility] ([facility] [address] [place]  [place], [place] [phone] or [phone]   [facility] ([facility]) [address] [place], [place] [phone] Accepts Medicaid  [facility] [address]  [place] [phone] Substance Abuse/Addiction Treatment   [facility] Organization         Address  Phone  Notes  [facility]  [phone]   Angie Fava, PhD [address] Ervin Knack [place], [place]   [phone] or [phone]   [facility] Behavioral   [address] [place], [place] [phone]   [facility] [address], [place], [place] [phone] Insurance/Medicaid/sponsorship through [facility] and Families [address]                                    [place], [place] [phone] Therapy/tele-psych/case  [facility] [address], [place] [phone]    Dr. Lolly Mustache  [phone]   [facility]  [employer] [facility] Dept. 1) [address] [place] 2) [address] [place] 3)  [address], [place] [phone]  [phone]   [facility] Child Abuse Hotline (706)039-7651 or [phone] (After Hours)

## 2013-03-30 LAB — CULTURE, BLOOD (ROUTINE X 2)
CULTURE: NO GROWTH
Culture: NO GROWTH

## 2013-06-22 ENCOUNTER — Ambulatory Visit: Payer: Medicaid Other

## 2014-11-16 IMAGING — CR DG TOE 3RD 2+V*L*
3 series · 3 of 3 positions shown · non-contrast
Comparison: none

[x toes ap left]
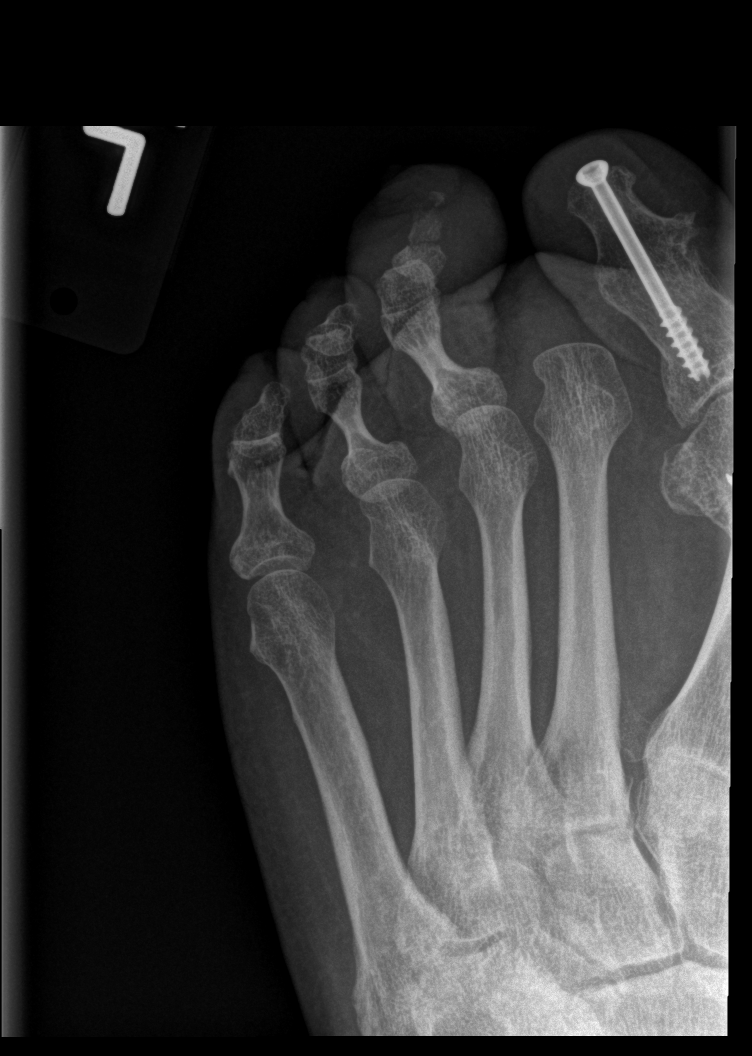

[x toes obl left]
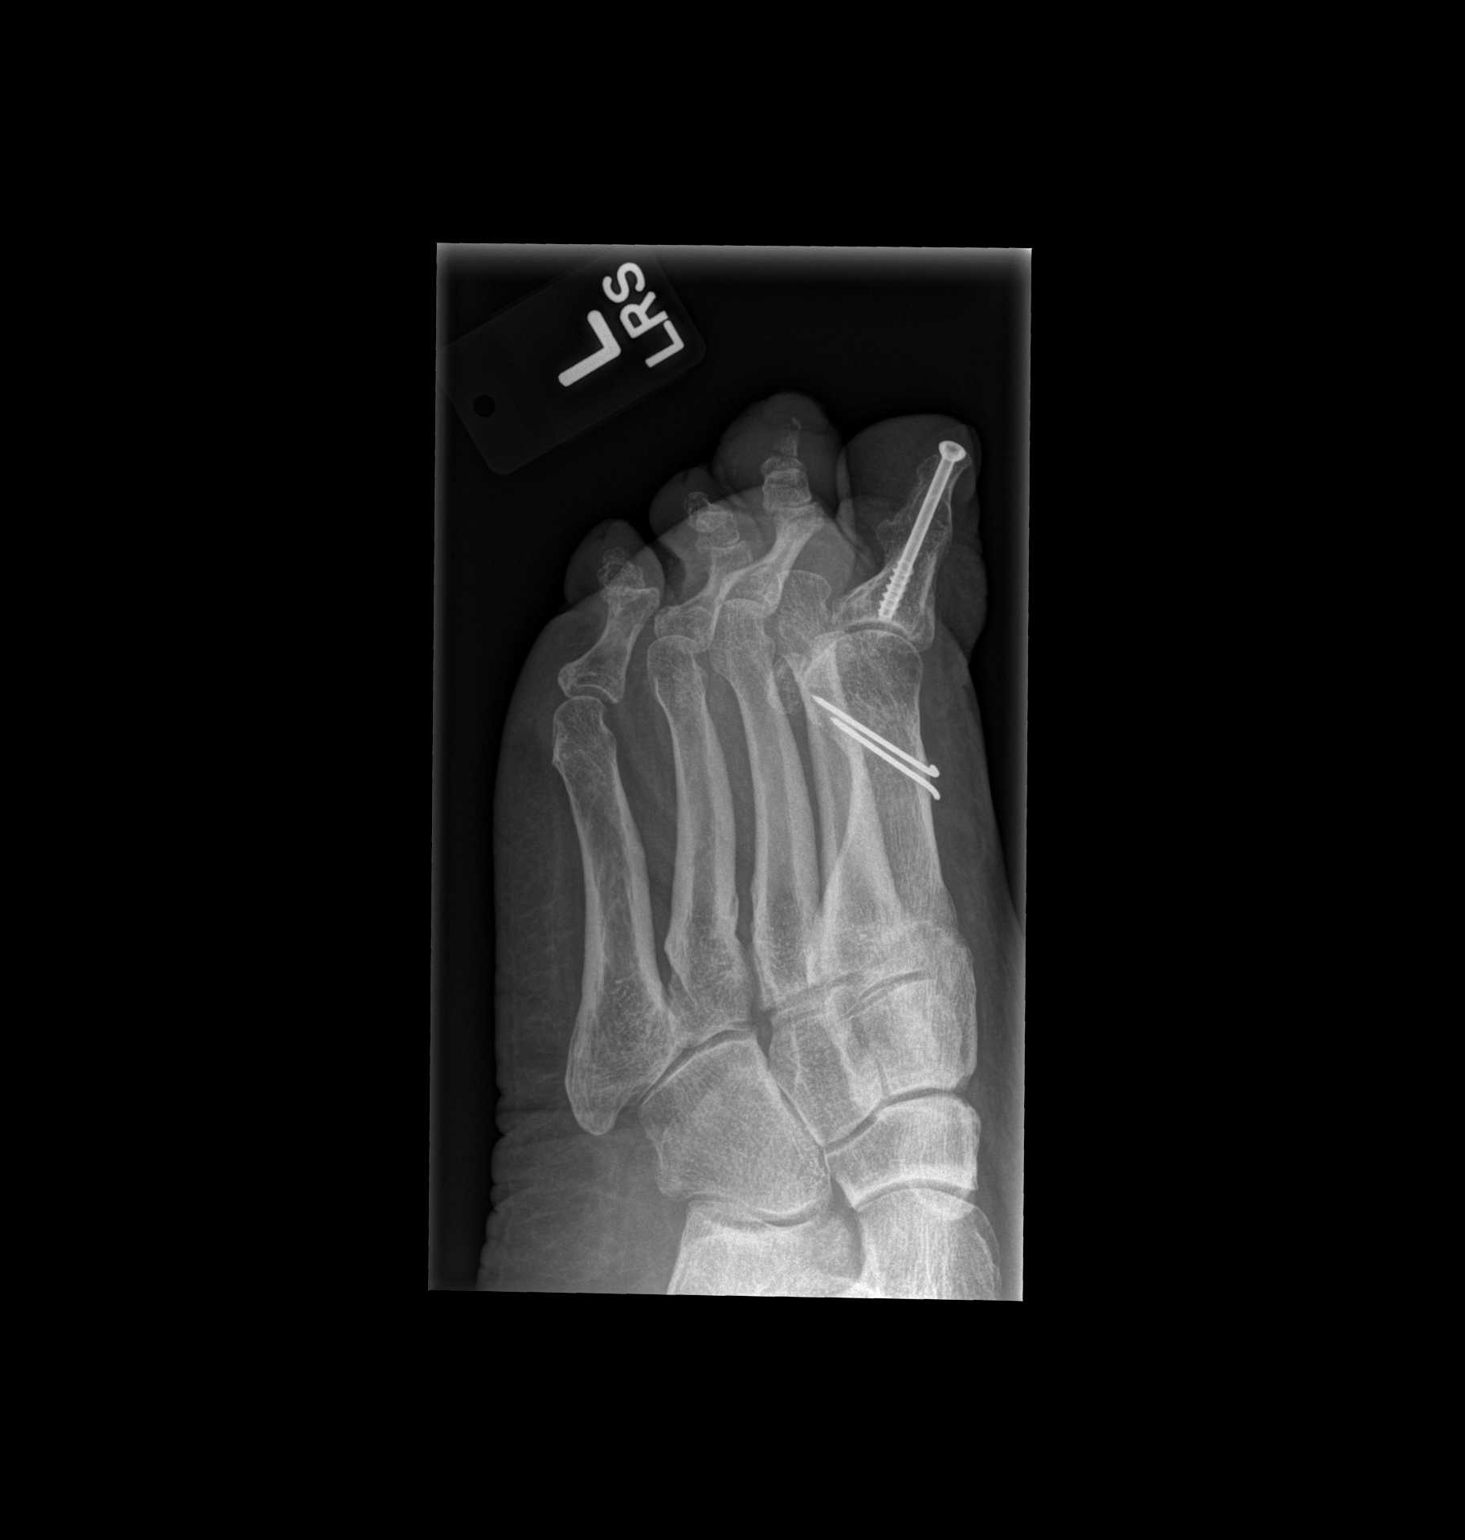

[x toes lat left]
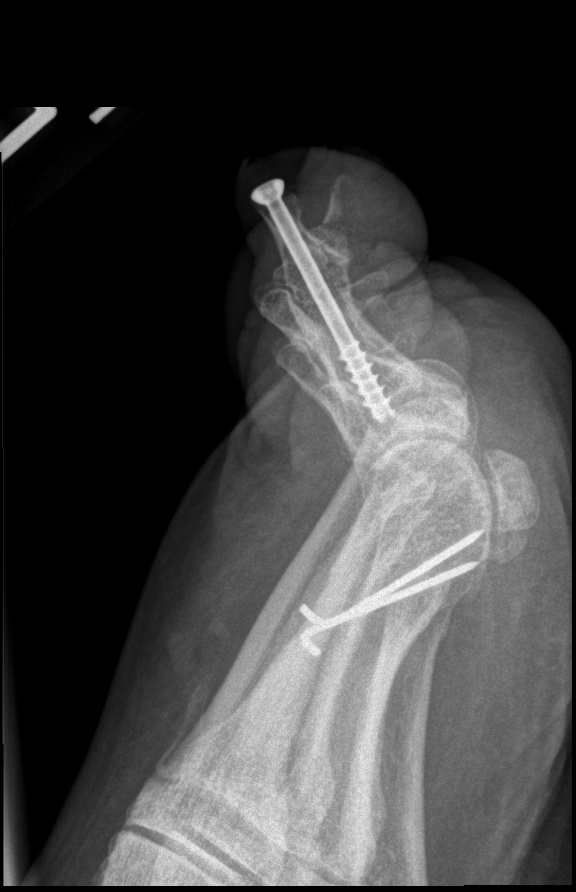

[3 of 3 positions shown; findings below may reference images not displayed]

CLINICAL DATA
Evaluate for osteomyelitis.

EXAM
LEFT THIRD TOE

COMPARISON
Prior radiograph from 09/23/2012

FINDINGS
Percutaneous lack fixation screw again seen traversing the left
first IP joint. Additional fixation pins traverse of the left first
metatarsal. No evidence of hardware complication. No periprosthetic
lucency to suggest loosening or infection.

There has been partial amputation of the left second ray. No osseous
erosion seen at the left second metatarsal head to suggest
osteomyelitis.

There has been progressive osseous erosion of the left third distal
phalanx, suspicious for acute osteomyelitis. No soft tissue
emphysema. Overlying soft tissue ulceration is present. No other
osseous erosions identified.

No acute fracture dislocation.

IMPRESSION
1. Progressive osseous erosion of the left third distal phalanx with
overlying soft tissue ulceration, worrisome for osteomyelitis. No
soft tissue emphysema identified.
2. No acute fracture or dislocation.

SIGNATURE

## 2014-11-16 IMAGING — CR DG CHEST 2V
2 series · 2 of 2 positions shown · non-contrast
Comparison: 01/11/2013

CLINICAL DATA: Diabetes, peripheral cellulitis, shortness of
breath, hypertension

EXAM:
CHEST  2 VIEW

[w chest pa]
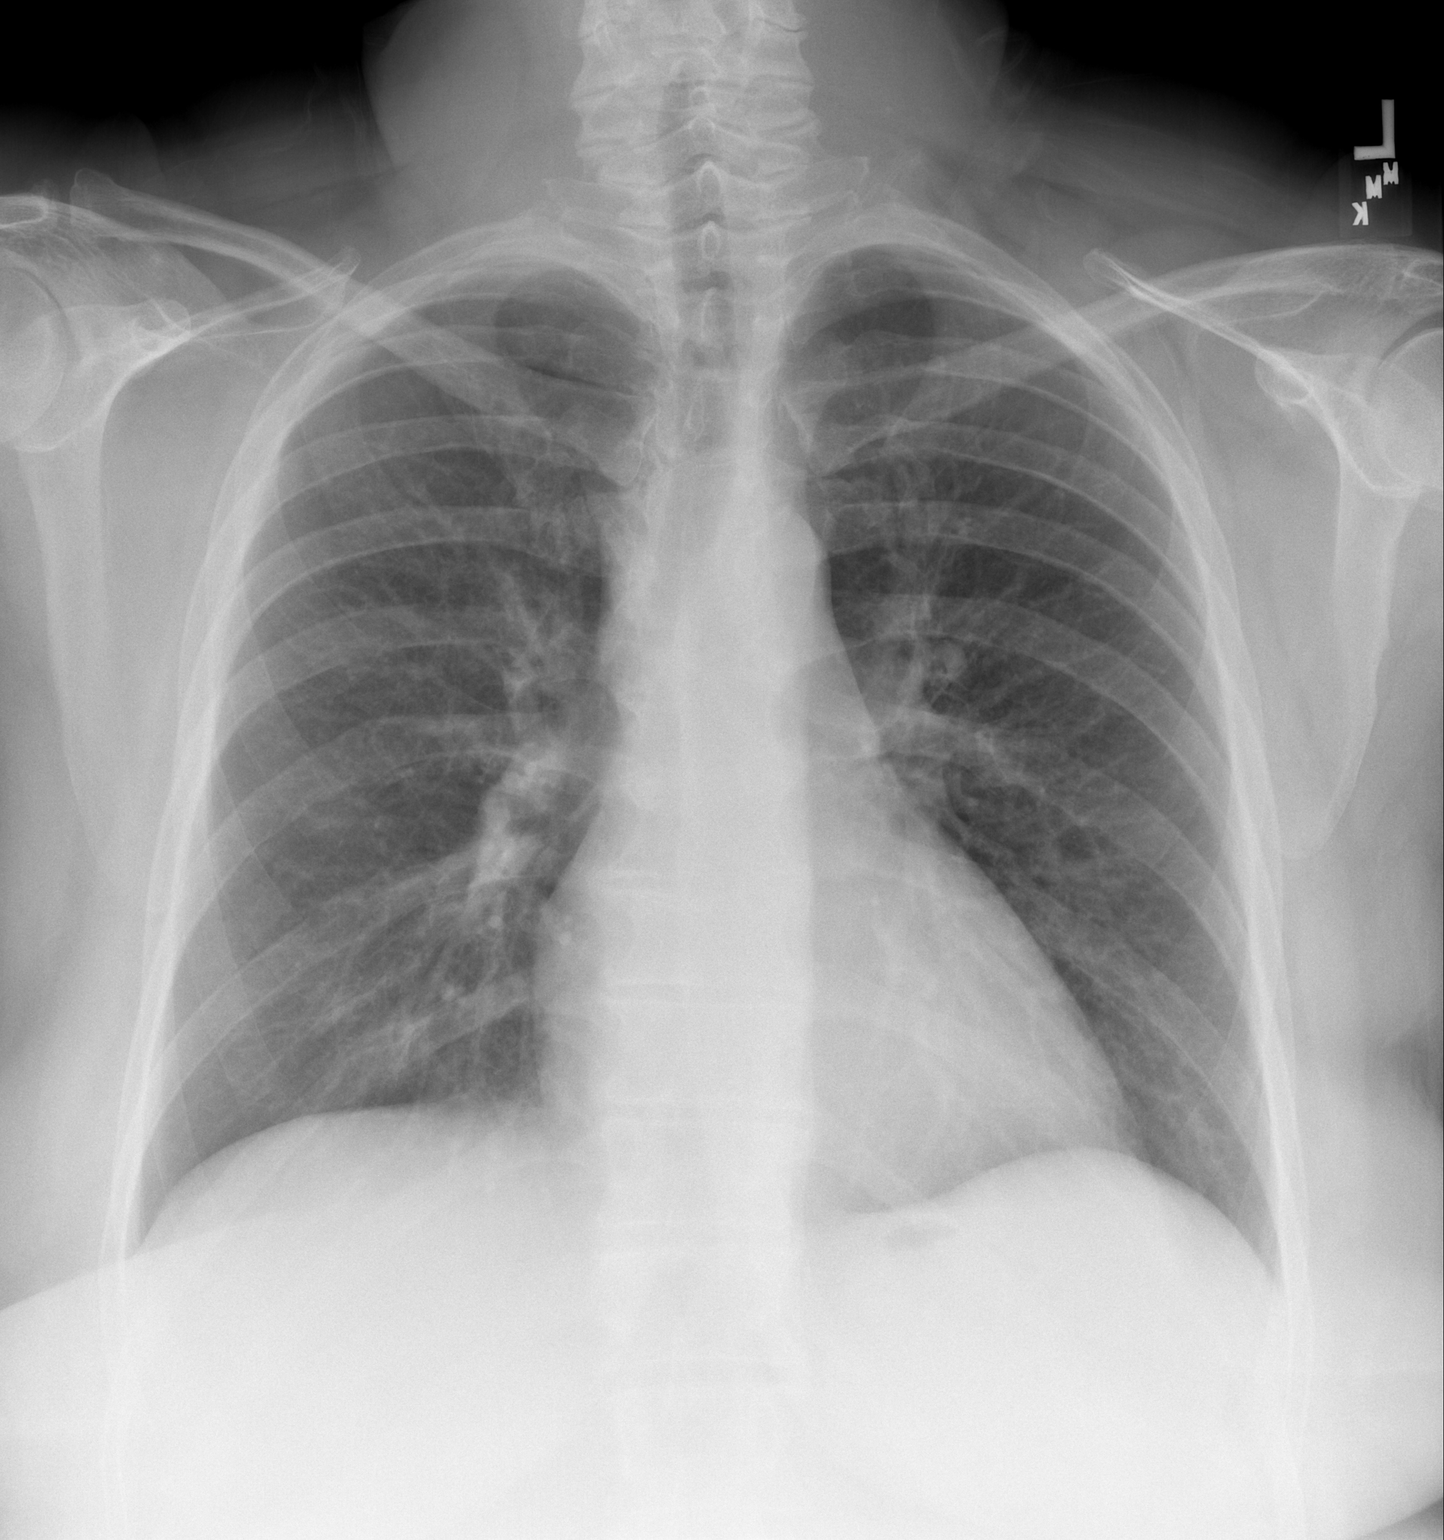

[w chest lat]
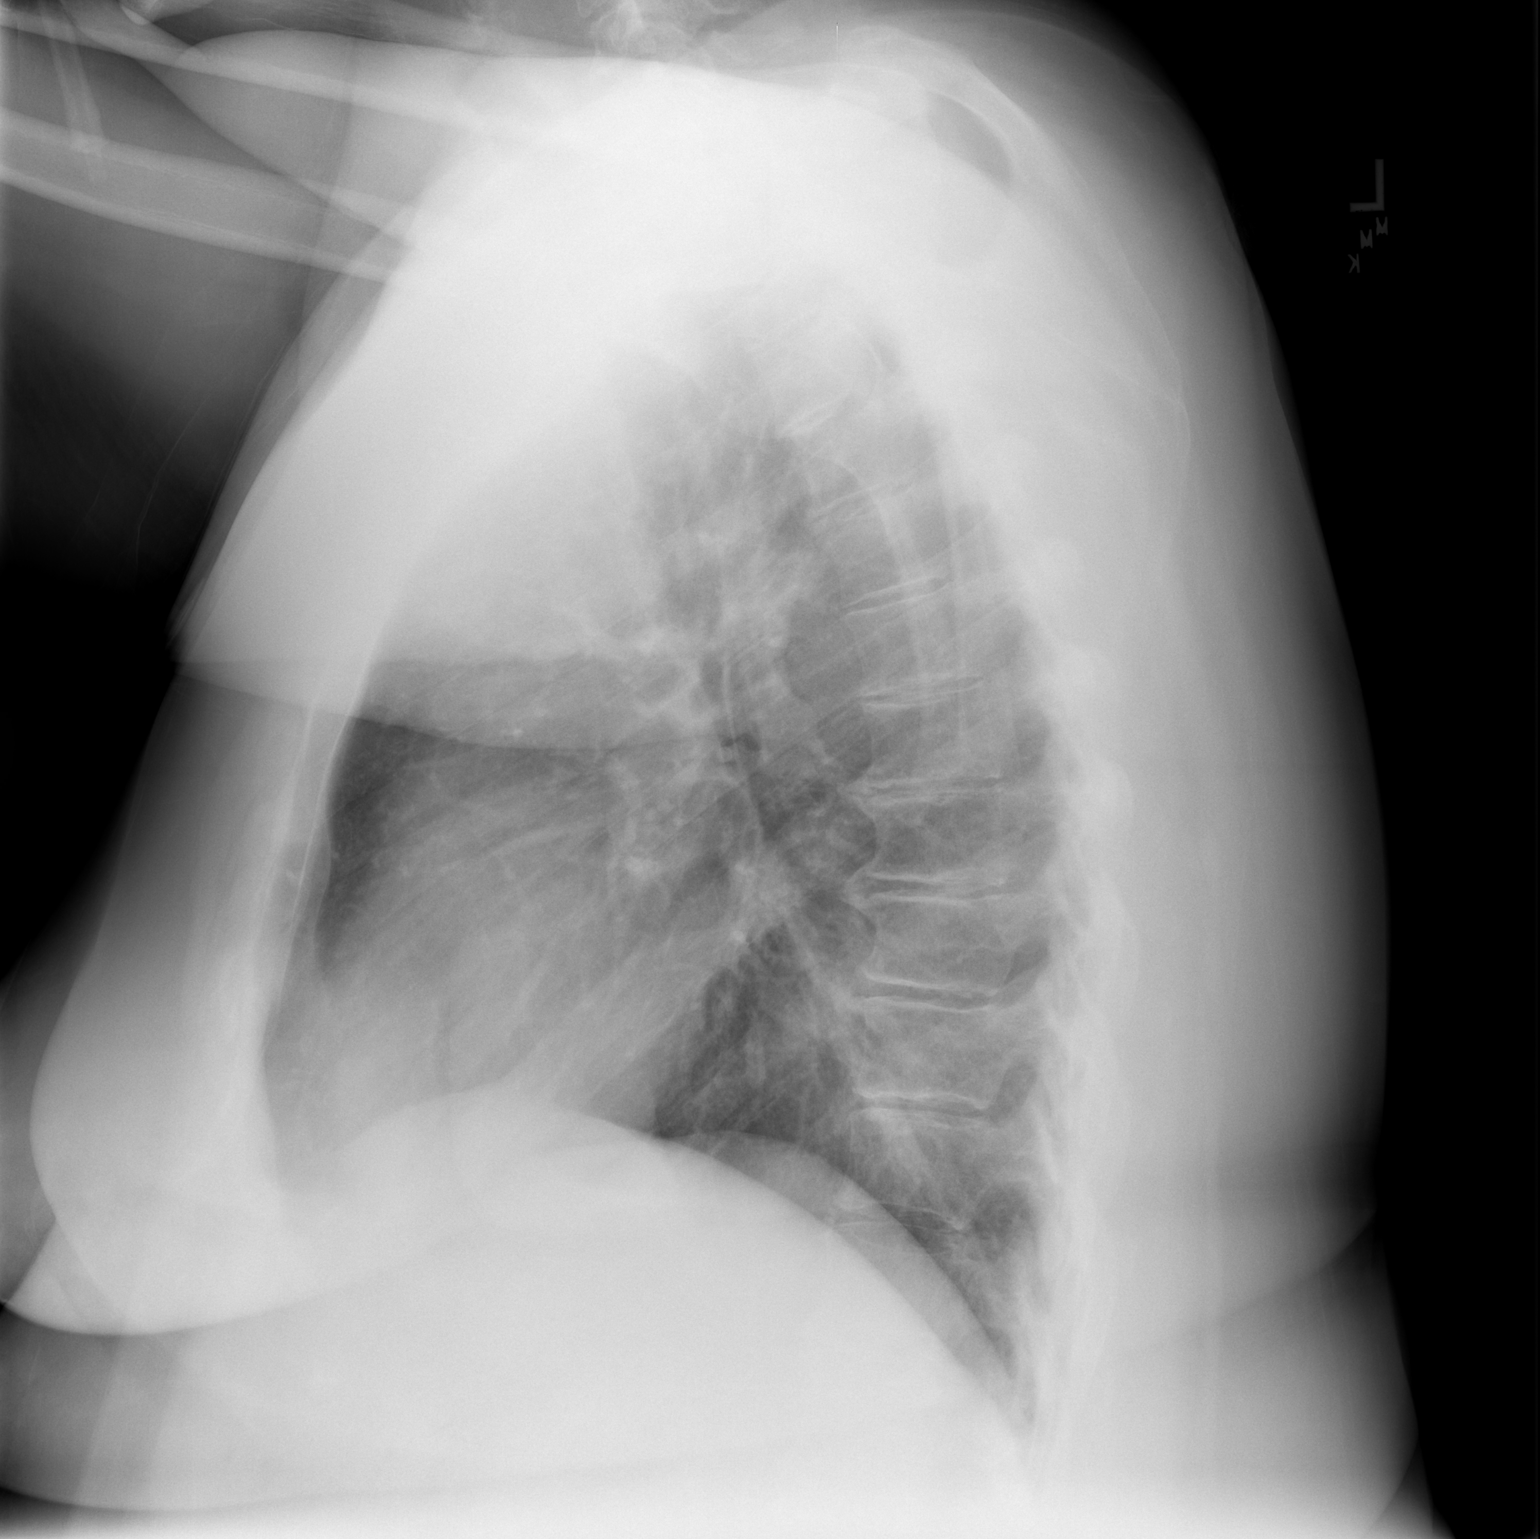

[2 of 2 positions shown; findings below may reference images not displayed]

FINDINGS: The heart size and mediastinal contours are within normal limits.
Both lungs are clear. The visualized skeletal structures are
unremarkable. Thoracic degenerative changes and spondylosis noted.
IMPRESSION: No active cardiopulmonary disease.

## 2015-07-02 DIAGNOSIS — L02511 Cutaneous abscess of right hand: Secondary | ICD-10-CM | POA: Diagnosis not present

## 2015-07-02 DIAGNOSIS — L02512 Cutaneous abscess of left hand: Secondary | ICD-10-CM | POA: Diagnosis not present

## 2015-07-02 DIAGNOSIS — F1910 Other psychoactive substance abuse, uncomplicated: Secondary | ICD-10-CM | POA: Diagnosis not present

## 2015-07-02 DIAGNOSIS — M659 Synovitis and tenosynovitis, unspecified: Secondary | ICD-10-CM | POA: Diagnosis not present

## 2015-07-03 DIAGNOSIS — L02511 Cutaneous abscess of right hand: Secondary | ICD-10-CM | POA: Diagnosis not present

## 2015-07-03 DIAGNOSIS — F1910 Other psychoactive substance abuse, uncomplicated: Secondary | ICD-10-CM | POA: Diagnosis not present

## 2015-07-03 DIAGNOSIS — M659 Synovitis and tenosynovitis, unspecified: Secondary | ICD-10-CM

## 2015-07-03 DIAGNOSIS — L02512 Cutaneous abscess of left hand: Secondary | ICD-10-CM

## 2015-07-04 DIAGNOSIS — M659 Synovitis and tenosynovitis, unspecified: Secondary | ICD-10-CM | POA: Diagnosis not present

## 2015-07-04 DIAGNOSIS — L02511 Cutaneous abscess of right hand: Secondary | ICD-10-CM

## 2015-07-04 DIAGNOSIS — L02512 Cutaneous abscess of left hand: Secondary | ICD-10-CM

## 2015-07-04 DIAGNOSIS — F1910 Other psychoactive substance abuse, uncomplicated: Secondary | ICD-10-CM | POA: Diagnosis not present

## 2015-07-05 DIAGNOSIS — F1910 Other psychoactive substance abuse, uncomplicated: Secondary | ICD-10-CM | POA: Diagnosis not present

## 2015-07-05 DIAGNOSIS — M659 Synovitis and tenosynovitis, unspecified: Secondary | ICD-10-CM | POA: Diagnosis not present

## 2015-07-05 DIAGNOSIS — L02511 Cutaneous abscess of right hand: Secondary | ICD-10-CM | POA: Diagnosis not present

## 2015-07-05 DIAGNOSIS — L02512 Cutaneous abscess of left hand: Secondary | ICD-10-CM | POA: Diagnosis not present

## 2015-07-06 DIAGNOSIS — L02511 Cutaneous abscess of right hand: Secondary | ICD-10-CM | POA: Diagnosis not present

## 2015-07-06 DIAGNOSIS — F1910 Other psychoactive substance abuse, uncomplicated: Secondary | ICD-10-CM | POA: Diagnosis not present

## 2015-07-06 DIAGNOSIS — L02512 Cutaneous abscess of left hand: Secondary | ICD-10-CM | POA: Diagnosis not present

## 2015-07-06 DIAGNOSIS — M659 Synovitis and tenosynovitis, unspecified: Secondary | ICD-10-CM | POA: Diagnosis not present

## 2015-08-14 DEATH — deceased
